# Patient Record
Sex: Female | Born: 1965 | Race: Black or African American | Hispanic: No | Marital: Married | Smoking: Never smoker
Health system: Southern US, Community
[De-identification: ages and names within clinical notes are randomized; demographics above are authoritative.]

## PROBLEM LIST (undated history)

## (undated) DIAGNOSIS — E669 Obesity, unspecified: Secondary | ICD-10-CM

## (undated) DIAGNOSIS — N200 Calculus of kidney: Secondary | ICD-10-CM

## (undated) HISTORY — DX: Obesity, unspecified: E66.9

## (undated) HISTORY — PX: TUBAL LIGATION: SHX77

## (undated) HISTORY — DX: Calculus of kidney: N20.0

---

## 2003-08-21 ENCOUNTER — Emergency Department (HOSPITAL_COMMUNITY): Admission: EM | Admit: 2003-08-21 | Discharge: 2003-08-21 | Payer: Self-pay | Admitting: Emergency Medicine

## 2003-08-31 ENCOUNTER — Ambulatory Visit (HOSPITAL_COMMUNITY): Admission: RE | Admit: 2003-08-31 | Discharge: 2003-08-31 | Payer: Self-pay | Admitting: Urology

## 2003-09-03 ENCOUNTER — Ambulatory Visit (HOSPITAL_COMMUNITY): Admission: RE | Admit: 2003-09-03 | Discharge: 2003-09-03 | Payer: Self-pay | Admitting: Urology

## 2010-10-28 ENCOUNTER — Encounter: Payer: Self-pay | Admitting: Family Medicine

## 2011-09-03 ENCOUNTER — Other Ambulatory Visit: Payer: Self-pay | Admitting: Occupational Therapy

## 2011-09-03 DIAGNOSIS — Z139 Encounter for screening, unspecified: Secondary | ICD-10-CM

## 2011-09-10 ENCOUNTER — Ambulatory Visit (HOSPITAL_COMMUNITY)
Admission: RE | Admit: 2011-09-10 | Discharge: 2011-09-10 | Disposition: A | Discharge status: Home / Self Care | Payer: 59 | Source: Ambulatory Visit | Attending: Family Medicine | Admitting: Family Medicine

## 2011-09-10 DIAGNOSIS — Z1231 Encounter for screening mammogram for malignant neoplasm of breast: Secondary | ICD-10-CM | POA: Insufficient documentation

## 2011-09-10 DIAGNOSIS — Z139 Encounter for screening, unspecified: Secondary | ICD-10-CM

## 2014-06-22 ENCOUNTER — Other Ambulatory Visit (HOSPITAL_COMMUNITY): Payer: Self-pay | Admitting: Nurse Practitioner

## 2014-06-22 DIAGNOSIS — Z139 Encounter for screening, unspecified: Secondary | ICD-10-CM

## 2014-06-30 ENCOUNTER — Ambulatory Visit (HOSPITAL_COMMUNITY)
Admission: RE | Admit: 2014-06-30 | Discharge: 2014-06-30 | Disposition: A | Discharge status: Home / Self Care | Payer: 59 | Source: Ambulatory Visit | Attending: Nurse Practitioner | Admitting: Nurse Practitioner

## 2014-06-30 DIAGNOSIS — Z1231 Encounter for screening mammogram for malignant neoplasm of breast: Secondary | ICD-10-CM | POA: Insufficient documentation

## 2014-06-30 DIAGNOSIS — Z139 Encounter for screening, unspecified: Secondary | ICD-10-CM

## 2015-06-24 ENCOUNTER — Other Ambulatory Visit (HOSPITAL_COMMUNITY): Payer: Self-pay | Admitting: Nurse Practitioner

## 2015-06-24 DIAGNOSIS — Z1231 Encounter for screening mammogram for malignant neoplasm of breast: Secondary | ICD-10-CM

## 2015-07-04 ENCOUNTER — Ambulatory Visit (HOSPITAL_COMMUNITY)
Admission: RE | Admit: 2015-07-04 | Discharge: 2015-07-04 | Disposition: A | Discharge status: Home / Self Care | Payer: 59 | Source: Ambulatory Visit | Attending: Nurse Practitioner | Admitting: Nurse Practitioner

## 2015-07-04 DIAGNOSIS — Z1231 Encounter for screening mammogram for malignant neoplasm of breast: Secondary | ICD-10-CM | POA: Diagnosis not present

## 2016-07-11 ENCOUNTER — Other Ambulatory Visit (HOSPITAL_COMMUNITY): Payer: Self-pay | Admitting: Nurse Practitioner

## 2016-07-11 DIAGNOSIS — Z1231 Encounter for screening mammogram for malignant neoplasm of breast: Secondary | ICD-10-CM

## 2016-07-25 ENCOUNTER — Ambulatory Visit (HOSPITAL_COMMUNITY): Payer: 59

## 2016-07-26 ENCOUNTER — Ambulatory Visit (HOSPITAL_COMMUNITY): Payer: 59

## 2016-08-20 ENCOUNTER — Ambulatory Visit (HOSPITAL_COMMUNITY)
Admission: RE | Admit: 2016-08-20 | Discharge: 2016-08-20 | Disposition: A | Discharge status: Home / Self Care | Payer: 59 | Source: Ambulatory Visit | Attending: Nurse Practitioner | Admitting: Nurse Practitioner

## 2016-08-20 DIAGNOSIS — Z1231 Encounter for screening mammogram for malignant neoplasm of breast: Secondary | ICD-10-CM | POA: Insufficient documentation

## 2017-07-26 ENCOUNTER — Other Ambulatory Visit (HOSPITAL_COMMUNITY): Payer: Self-pay | Admitting: Nurse Practitioner

## 2017-07-26 DIAGNOSIS — Z1231 Encounter for screening mammogram for malignant neoplasm of breast: Secondary | ICD-10-CM

## 2017-08-26 ENCOUNTER — Ambulatory Visit (HOSPITAL_COMMUNITY): Payer: 59

## 2018-10-26 ENCOUNTER — Emergency Department (HOSPITAL_COMMUNITY): Payer: Managed Care, Other (non HMO)

## 2018-10-26 ENCOUNTER — Emergency Department (HOSPITAL_COMMUNITY)
Admission: EM | Admit: 2018-10-26 | Discharge: 2018-10-26 | Disposition: A | Discharge status: Home / Self Care | Payer: Managed Care, Other (non HMO) | Attending: Emergency Medicine | Admitting: Emergency Medicine

## 2018-10-26 ENCOUNTER — Encounter (HOSPITAL_COMMUNITY): Payer: Self-pay

## 2018-10-26 DIAGNOSIS — R1030 Lower abdominal pain, unspecified: Secondary | ICD-10-CM | POA: Diagnosis present

## 2018-10-26 DIAGNOSIS — K529 Noninfective gastroenteritis and colitis, unspecified: Secondary | ICD-10-CM | POA: Diagnosis not present

## 2018-10-26 LAB — WET PREP, GENITAL
Clue Cells Wet Prep HPF POC: NONE SEEN
Sperm: NONE SEEN
Trich, Wet Prep: NONE SEEN
Yeast Wet Prep HPF POC: NONE SEEN

## 2018-10-26 LAB — URINALYSIS, ROUTINE W REFLEX MICROSCOPIC
Bilirubin Urine: NEGATIVE
Glucose, UA: NEGATIVE mg/dL
Hgb urine dipstick: NEGATIVE
Ketones, ur: 80 mg/dL — AB
Leukocytes, UA: NEGATIVE
Nitrite: NEGATIVE
Protein, ur: NEGATIVE mg/dL
Specific Gravity, Urine: 1.025 (ref 1.005–1.030)
pH: 5 (ref 5.0–8.0)

## 2018-10-26 LAB — COMPREHENSIVE METABOLIC PANEL
ALT: 14 U/L (ref 0–44)
AST: 19 U/L (ref 15–41)
Albumin: 3.8 g/dL (ref 3.5–5.0)
Alkaline Phosphatase: 68 U/L (ref 38–126)
Anion gap: 12 (ref 5–15)
BUN: 11 mg/dL (ref 6–20)
CO2: 25 mmol/L (ref 22–32)
Calcium: 9.3 mg/dL (ref 8.9–10.3)
Chloride: 103 mmol/L (ref 98–111)
Creatinine, Ser: 0.94 mg/dL (ref 0.44–1.00)
GFR calc Af Amer: >60 mL/min (ref >60)
GFR calc non Af Amer: >60 mL/min (ref >60)
Glucose, Bld: 83 mg/dL (ref 70–99)
Potassium: 3.7 mmol/L (ref 3.5–5.1)
Sodium: 140 mmol/L (ref 135–145)
Total Bilirubin: 0.9 mg/dL (ref 0.3–1.2)
Total Protein: 7.8 g/dL (ref 6.5–8.1)

## 2018-10-26 LAB — CBC WITH DIFFERENTIAL/PLATELET
Abs Immature Granulocytes: 0.05 10*3/uL (ref 0.00–0.07)
Basophils Absolute: 0 10*3/uL (ref 0.0–0.1)
Basophils Relative: 0 %
Eosinophils Absolute: 0.1 10*3/uL (ref 0.0–0.5)
Eosinophils Relative: 1 %
HCT: 40.2 % (ref 36.0–46.0)
Hemoglobin: 12.4 g/dL (ref 12.0–15.0)
Immature Granulocytes: 1 %
Lymphocytes Relative: 21 %
Lymphs Abs: 2.1 10*3/uL (ref 0.7–4.0)
MCH: 26.2 pg (ref 26.0–34.0)
MCHC: 30.8 g/dL (ref 30.0–36.0)
MCV: 84.8 fL (ref 80.0–100.0)
Monocytes Absolute: 0.6 10*3/uL (ref 0.1–1.0)
Monocytes Relative: 6 %
Neutro Abs: 7.4 10*3/uL (ref 1.7–7.7)
Neutrophils Relative %: 71 %
Platelets: 261 10*3/uL (ref 150–400)
RBC: 4.74 MIL/uL (ref 3.87–5.11)
RDW: 13.6 % (ref 11.5–15.5)
WBC: 10.3 10*3/uL (ref 4.0–10.5)
nRBC: 0 % (ref 0.0–0.2)

## 2018-10-26 MED ORDER — ONDANSETRON 4 MG PO TBDP: 4.0000 mg | ORAL_TABLET | Freq: Three times a day (TID) | ORAL | 0 refills | Status: DC | PRN

## 2018-10-26 MED ORDER — SODIUM CHLORIDE 0.9 % IV BOLUS
1000.0000 mL | Freq: Once | INTRAVENOUS | Status: AC
  Administered 2018-10-26: 1000 mL via INTRAVENOUS

## 2018-10-26 MED ORDER — METRONIDAZOLE 500 MG PO TABS: 500.0000 mg | ORAL_TABLET | Freq: Three times a day (TID) | ORAL | 0 refills | Status: AC

## 2018-10-26 MED ORDER — MORPHINE SULFATE (PF) 4 MG/ML IV SOLN
4.0000 mg | Freq: Once | INTRAVENOUS | Status: AC
  Administered 2018-10-26: 4 mg via INTRAVENOUS
  Filled 2018-10-26: qty 1

## 2018-10-26 MED ORDER — CIPROFLOXACIN HCL 500 MG PO TABS: 500.0000 mg | ORAL_TABLET | Freq: Two times a day (BID) | ORAL | 0 refills | Status: AC

## 2018-10-26 MED ORDER — IOHEXOL 300 MG/ML  SOLN
100.0000 mL | Freq: Once | INTRAMUSCULAR | Status: AC | PRN
  Administered 2018-10-26: 100 mL via INTRAVENOUS

## 2018-10-26 MED ORDER — IOPAMIDOL (ISOVUE-300) INJECTION 61%: 100.0000 mL | Freq: Once | INTRAVENOUS | Status: DC | PRN

## 2018-10-26 NOTE — ED Triage Notes (Signed)
Onset 2 days constant lower abd pain, nausea, vomiting.  Last vomited 2am.  Pt took Advil yesterday with no relief.

## 2018-10-26 NOTE — ED Provider Notes (Signed)
[facility] EMERGENCY DEPARTMENT Provider Note   CSN: [id number] Arrival date & time: 10/26/18  1647     History   Chief Complaint Chief Complaint  Patient presents with  . Abdominal Pain    HPI Kara Moreno is a 53 y.o. female who presents to ED for 3 to 4-day history of intermittent lower abdominal pain.  She reports associated nausea and 3-4 episodes of nonbloody, nonbilious emesis secondary to pain.  She has not eaten anything in over 24 hours secondary to her discomfort and vomiting.  She took Advil yesterday but ended up vomiting afterwards.  She notes vague abdominal pain in the past but not in this location and never last this long.  She had a normal bowel movement yesterday.  She denies any urinary symptoms or history of kidney stones.  She denies any sick contacts with similar symptoms.  She is sexually active in a monogamous relationship with her husband and is not concerned about STDs.  She has no vaginal discharge or abnormal bleeding, recent travel.  Prior abdominal surgeries include tubal ligation about 63yrs ago.  HPI  History reviewed. No pertinent past medical history.  There are no active problems to display for this patient.   Past Surgical History:  Procedure Laterality Date  . TUBAL LIGATION       OB History   No obstetric history on file.      Home Medications    Prior to Admission medications   Medication Sig Start Date End Date Taking? Authorizing Provider  ciprofloxacin (CIPRO) 500 MG tablet Take 1 tablet (500 mg total) by mouth 2 (two) times daily for 7 days. 10/26/18 11/02/18  Amiracle Neises, PA-C  metroNIDAZOLE (FLAGYL) 500 MG tablet Take 1 tablet (500 mg total) by mouth 3 (three) times daily for 7 days. 10/26/18 11/02/18  Nataliyah Packham, PA-C  ondansetron (ZOFRAN ODT) 4 MG disintegrating tablet Take 1 tablet (4 mg total) by mouth every 8 (eight) hours as needed for nausea or vomiting. 10/26/18   Dietrich Pates, PA-C    Family  History History reviewed. No pertinent family history.  Social History Social History   Tobacco Use  . Smoking status: Never Smoker  Substance Use Topics  . Alcohol use: Never    Frequency: Never  . Drug use: Never     Allergies   Patient has no known allergies.   Review of Systems Review of Systems  Constitutional: Negative for appetite change, chills and fever.  HENT: Negative for ear pain, rhinorrhea, sneezing and sore throat.   Eyes: Negative for photophobia and visual disturbance.  Respiratory: Negative for cough, chest tightness, shortness of breath and wheezing.   Cardiovascular: Negative for chest pain and palpitations.  Gastrointestinal: Positive for abdominal pain, nausea and vomiting. Negative for blood in stool, constipation and diarrhea.  Genitourinary: Negative for dysuria, hematuria and urgency.  Musculoskeletal: Negative for myalgias.  Skin: Negative for rash.  Neurological: Negative for dizziness, weakness and light-headedness.     Physical Exam Updated Vital Signs BP 135/68 (BP Location: Right Arm)   Pulse 78   Temp 98.3 F (36.8 C) (Oral)   Resp 18   SpO2 100%   Physical Exam Vitals signs and nursing note reviewed. Exam conducted with a chaperone present.  Constitutional:      General: She is not in acute distress.    Appearance: She is well-developed.  HENT:     Head: Normocephalic and atraumatic.     Nose: Nose normal.  Eyes:     General: No scleral icterus.       Left eye: No discharge.     Conjunctiva/sclera: Conjunctivae normal.  Neck:     Musculoskeletal: Normal range of motion and neck supple.  Cardiovascular:     Rate and Rhythm: Normal rate and regular rhythm.     Heart sounds: Normal heart sounds. No murmur. No friction rub. No gallop.   Pulmonary:     Effort: Pulmonary effort is normal. No respiratory distress.     Breath sounds: Normal breath sounds.  Abdominal:     General: Bowel sounds are normal. There is no distension.      Palpations: Abdomen is soft.     Tenderness: There is abdominal tenderness in the suprapubic area. There is no guarding.  Genitourinary:    Cervix: Discharge present.     Uterus: Normal.      Adnexa:        Right: No tenderness.         Left: No tenderness.       Comments: Pelvic exam: normal external genitalia without evidence of trauma. VULVA: normal appearing vulva with no masses, tenderness or lesion. VAGINA: normal appearing vagina with normal color and discharge, no lesions. CERVIX: normal appearing cervix without lesions, cervical motion tenderness absent, cervical os closed with out purulent discharge; No vaginal discharge. Wet prep and DNA probe for chlamydia and GC obtained.   ADNEXA: normal adnexa in size, nontender and no masses UTERUS: uterus is normal size, shape, consistency and nontender.   Musculoskeletal: Normal range of motion.  Skin:    General: Skin is warm and dry.     Findings: No rash.  Neurological:     Mental Status: She is alert.     Motor: No abnormal muscle tone.     Coordination: Coordination normal.      ED Treatments / Results  Labs (all labs ordered are listed, but only abnormal results are displayed) Labs Reviewed  WET PREP, GENITAL - Abnormal; Notable for the following components:      Result Value   WBC, Wet Prep HPF POC MANY (*)    All other components within normal limits  URINALYSIS, ROUTINE W REFLEX MICROSCOPIC - Abnormal; Notable for the following components:   APPearance HAZY (*)    Ketones, ur 80 (*)    All other components within normal limits  COMPREHENSIVE METABOLIC PANEL  CBC WITH DIFFERENTIAL/PLATELET  GC/CHLAMYDIA PROBE AMP () NOT AT [facility]    EKG None  Radiology Ct Abdomen Pelvis W Contrast  Result Date: 10/26/2018 CLINICAL DATA:  53 year old female with lower abdominal pain nausea and vomiting. EXAM: CT ABDOMEN AND PELVIS WITH CONTRAST TECHNIQUE: Multidetector CT imaging of the abdomen and pelvis was  performed using the standard protocol following bolus administration of intravenous contrast. CONTRAST:  100mL OMNIPAQUE IOHEXOL 300 MG/ML  SOLN COMPARISON:  CT Abdomen and Pelvis 08/21/2003. FINDINGS: Lower chest: Negative aside from low lung volumes with minor atelectasis. Hepatobiliary: A small round 8-9 millimeter hypodense area in the anterior right hepatic lobe is indeterminate but probably benign, such as a cyst or hemangioma. There is a similar 9 millimeter hypodense lesion in the posterior lobe which seems to have simple fluid density. These were not evident in 2004. Questionable underlying hepatic steatosis. Negative gallbladder. Pancreas: There is a tiny hypodense area in the pancreatic body on series 3, image 24. This was not evident in 2004. Otherwise normal pancreatic enhancement. No main pancreatic ductal enlargement. Spleen: Negative.  Adrenals/Urinary Tract: Normal adrenal glands. Renal enhancement and contrast excretion is symmetric and normal. Decompressed proximal ureters. Diminutive and unremarkable urinary bladder. Stomach/Bowel: Negative rectum and distal sigmoid. Moderate inflammation at the junction of the descending and sigmoid colon where extensive diverticulosis is noted. Inflammation continues through the proximal sigmoid where wall there is pronounced circumferential wall thickening and adjacent mesenteric edema. All told, a segment of about 15 centimeters of large bowel is affected. No extraluminal gas. Trace fluid layering in the gutter. No fluid collection. Diverticulosis continues upstream to the transverse colon, and there is extensive diverticulosis in the right colon. No other large bowel inflammation. Normal appendix. Negative terminal ileum. No dilated small bowel. Decompressed stomach and duodenum. No free air.  No free fluid in the upper abdomen. Vascular/Lymphatic: Suboptimal intravascular contrast bolus but the major arterial structures appear to be patent. The portal venous  system appears grossly patent. Reproductive: Negative. Other: Trace simple appearing pelvic free fluid mostly on the left (series 3, image 74). Musculoskeletal: No acute osseous abnormality identified. IMPRESSION: 1. Moderately inflamed 15 cm segment of large bowel from the distal descending through the proximal sigmoid colon compatible with Acute Diverticulitis/colitis. Trace free fluid appears reactive. No abscess or complicating features. 2. Occasional subcentimeter hypodense areas in the liver and pancreatic body are nonspecific but probably benign. Guidelines suggest annual imaging of the pancreatic lesion x5 years to document stability. Abdomen MRI (pancreas protocol without and with contrast) is the modality of choice. This recommendation follows ACR consensus guidelines: Management of Incidental Pancreatic Cysts: A White Paper of the ACR Incidental Findings Committee. J Am Coll Radiol 2017;14:911-923. Electronically Signed   By: Odessa FlemingH  Hall M.D.   On: 10/26/2018 20:37    Procedures Procedures (including critical care time)  Medications Ordered in ED Medications  iopamidol (ISOVUE-300) 61 % injection 100 mL (has no administration in time range)  sodium chloride 0.9 % bolus 1,000 mL (1,000 mLs Intravenous New Bag/Given 10/26/18 1822)  morphine 4 MG/ML injection 4 mg (4 mg Intravenous Given 10/26/18 1822)  iohexol (OMNIPAQUE) 300 MG/ML solution 100 mL (100 mLs Intravenous Contrast Given 10/26/18 1949)     Initial Impression / Assessment and Plan / ED Course  I have reviewed the triage vital signs and the nursing notes.  Pertinent labs & imaging results that were available during my care of the patient were reviewed by me and considered in my medical decision making (see chart for details).     53 year old female presents to ED for 3 to 4-day history of lower abdominal pain, nausea and nonbloody, nonbilious emesis.  She had normal bowel movement yesterday.  She has had vague sounding abdominal  pain in the past but has not lasted this long.  On my exam there is tenderness to palpation of the suprapubic area without rebound or guarding.  Pelvic exam revealed no cervical motion tenderness, adnexal tenderness.  Scant thin white discharge noted.  Lab work including CBC, CMP, urinalysis unremarkable.  Wet prep is negative.  GC chlamydia probe pending.  CT and pelvis shows findings consistent with possible colitis.  She denies any diarrhea, but due to remainder of lab work and work-up being reassuring, will treat for colitis.  She was scheduled for colonoscopy about 2 months ago but had to reschedule due to a death in the family.  Have encouraged her to have a colonoscopy done because of her age and also due to possible diverticulosis.  Patient is agreeable to the plan.  She is comfortable here and  is able to tolerate p.o. intake without difficulty prior to discharge.  Patient is hemodynamically stable, in NAD, and able to ambulate in the ED. Evaluation does not show pathology that would require ongoing emergent intervention or inpatient treatment. I explained the diagnosis to the patient. Pain has been managed and has no complaints prior to discharge. Patient is comfortable with above plan and is stable for discharge at this time. All questions were answered prior to disposition. Strict return precautions for returning to the ED were discussed. Encouraged follow up with PCP.    Portions of this note were generated with Scientist, clinical (histocompatibility and immunogenetics). Dictation errors may occur despite best attempts at proofreading.   Final Clinical Impressions(s) / ED Diagnoses   Final diagnoses:  Colitis    ED Discharge Orders         Ordered    metroNIDAZOLE (FLAGYL) 500 MG tablet  3 times daily     10/26/18 2107    ciprofloxacin (CIPRO) 500 MG tablet  2 times daily     10/26/18 2107    ondansetron (ZOFRAN ODT) 4 MG disintegrating tablet  Every 8 hours PRN     10/26/18 2107           Dietrich Pates,  PA-C 10/26/18 2120    Derwood Kaplan, MD 10/28/18 2325

## 2018-10-26 NOTE — Discharge Instructions (Signed)
Take the antibiotics as directed.  Take Zofran as needed for nausea. Return to ED for worsening symptoms, fever, increased abdominal pain, blood in your stool.

## 2018-10-26 NOTE — ED Notes (Signed)
Patient transported to CT 

## 2018-10-27 LAB — GC/CHLAMYDIA PROBE AMP (~~LOC~~) NOT AT ARMC
Chlamydia: NEGATIVE
Neisseria Gonorrhea: NEGATIVE

## 2018-11-13 ENCOUNTER — Encounter: Payer: Self-pay | Admitting: Gastroenterology

## 2018-11-17 ENCOUNTER — Other Ambulatory Visit (HOSPITAL_COMMUNITY): Payer: Self-pay | Admitting: Nurse Practitioner

## 2018-11-17 DIAGNOSIS — Z1231 Encounter for screening mammogram for malignant neoplasm of breast: Secondary | ICD-10-CM

## 2018-12-03 ENCOUNTER — Ambulatory Visit (HOSPITAL_COMMUNITY)
Admission: RE | Admit: 2018-12-03 | Discharge: 2018-12-03 | Disposition: A | Discharge status: Home / Self Care | Payer: Managed Care, Other (non HMO) | Source: Ambulatory Visit | Attending: Nurse Practitioner | Admitting: Nurse Practitioner

## 2018-12-03 DIAGNOSIS — Z1231 Encounter for screening mammogram for malignant neoplasm of breast: Secondary | ICD-10-CM | POA: Insufficient documentation

## 2019-01-15 ENCOUNTER — Encounter: Payer: Self-pay | Admitting: Gastroenterology

## 2019-01-15 ENCOUNTER — Ambulatory Visit (INDEPENDENT_AMBULATORY_CARE_PROVIDER_SITE_OTHER): Payer: Managed Care, Other (non HMO) | Admitting: Gastroenterology

## 2019-01-15 ENCOUNTER — Telehealth: Payer: Self-pay | Admitting: *Deleted

## 2019-01-15 ENCOUNTER — Other Ambulatory Visit: Payer: Self-pay

## 2019-01-15 ENCOUNTER — Other Ambulatory Visit: Payer: Self-pay | Admitting: *Deleted

## 2019-01-15 DIAGNOSIS — R933 Abnormal findings on diagnostic imaging of other parts of digestive tract: Secondary | ICD-10-CM | POA: Diagnosis not present

## 2019-01-15 DIAGNOSIS — K869 Disease of pancreas, unspecified: Secondary | ICD-10-CM | POA: Diagnosis not present

## 2019-01-15 DIAGNOSIS — K769 Liver disease, unspecified: Secondary | ICD-10-CM | POA: Insufficient documentation

## 2019-01-15 MED ORDER — PEG 3350-KCL-NA BICARB-NACL 420 G PO SOLR: 4000.0000 mL | Freq: Once | ORAL | 0 refills | Status: AC

## 2019-01-15 NOTE — Telephone Encounter (Signed)
Called patient to schedule TCS with [facility]. She is scheduled for 7/13 at 8:30am. Patient aware will need to arrive at [facility] around 7:30am. Aware I am mailing instructions (confirmed address). Prep sent to the pharmacy. Orders entered.

## 2019-01-15 NOTE — Progress Notes (Signed)
REVIEWED-NO ADDITIONAL RECOMMENDATIONS. 

## 2019-01-15 NOTE — Patient Instructions (Signed)
1. Colonoscopy to be scheduled.  Please see separate instructions. 2. We will follow-up on the 2 liver lesions, pancreatic lesions with an MRI of the abdomen in 1 year.  These lesions appear benign but radiologist recommends following in 1 year as discussed.

## 2019-01-15 NOTE — Progress Notes (Addendum)
REVIEWED-NO ADDITIONAL RECOMMENDATIONS.  Primary Care Physician:  Erasmo Downer, NP Primary GI:  Jonette Eva, MD    Patient Location: Home Provider Location: [facility] office  Reason for Visit: To schedule colonoscopy  Persons present on the virtual encounter, with roles: Patient, provider (myself), Elinor Parkinson, CMA (updated meds, allergies)  Total time (minutes) spent on medical discussion: 20 minutes (15 minutes of median intra-service time, 5 minutes of chart review)  Due to COVID-19, visit was conducted using ZOOM method.  Visit was requested by patient.  Virtual Visit via ZOOM  I connected with NESHIA ELSASS on 01/15/19 at  2:00 PM EDT by ZOOM and verified that I am speaking with the correct person using two identifiers.   I discussed the limitations, risks, security and privacy concerns of performing an evaluation and management service by telephone/video and the availability of in person appointments. I also discussed with the patient that there may be a patient responsible charge related to this service. The patient expressed understanding and agreed to proceed.   HPI:   Kara Moreno is a 53 y.o. female who presents at the request of Cheron Every, FNP at [facility] to schedule screening colonoscopy.  Since we received her referral for screening colonoscopy, patient ended up in the emergency department with 3-day history of abdominal pain and vomiting. CT abdomen pelvis with contrast.  She had questionable underlying hepatic steatosis.  Moderate inflammation at the junction of the descending and sigmoid colon were extensive diverticulosis noted.  Inflammation continues to the proximal sigmoid where wall there is pronounced with circumferential wall thickening and adjacent mesenteric edema.  A total of 15 cm of the bowel affected.  Occasional subcentimeter hypodense areas in the liver and pancreatic body nonspecific but probably benign.   Guidelines suggest annual imaging of the pancreatic lesion for 5 years to document stability.  Abdominal MRI with pancreatic protocol is the modality of choice.  She had a milder episode about a week ago described as abdominal cramping, bloating and nausea.  Subsided fairly quickly.  Bowel movements lately have been fairly regular.  She denies any blood in the stool or melena.  Denies abdominal pain, heartburn, dysphagia.  No known family history of inflammatory bowel disease or colon cancer.  Current Outpatient Medications  Medication Sig Dispense Refill   ondansetron (ZOFRAN ODT) 4 MG disintegrating tablet Take 1 tablet (4 mg total) by mouth every 8 (eight) hours as needed for nausea or vomiting. (Patient taking differently: Take 4 mg by mouth as needed for nausea or vomiting. ) 4 tablet 0   Vitamin D, Ergocalciferol, (DRISDOL) 1.25 MG (50000 UT) CAPS capsule Take 50,000 Units by mouth once a week.     No current facility-administered medications for this visit.     Past Medical History:  Diagnosis Date   Kidney stone    Obesity     Past Surgical History:  Procedure Laterality Date   TUBAL LIGATION      Family History  Problem Relation Age of Onset   Diabetes Mellitus II Mother    Diverticulitis Mother    Lung cancer Father    Renal Disease Brother    Colon cancer Neg Hx    Inflammatory bowel disease Neg Hx     Social History   Socioeconomic History   Marital status: Married    Spouse name: Not on file   Number of children: Not on file   Years of education: Not on file  Highest education level: Not on file  Occupational History   Not on file  Social Needs   Financial resource strain: Not on file   Food insecurity:    Worry: Not on file    Inability: Not on file   Transportation needs:    Medical: Not on file    Non-medical: Not on file  Tobacco Use   Smoking status: Never Smoker   Smokeless tobacco: Never Used  Substance and Sexual  Activity   Alcohol use: Never    Frequency: Never   Drug use: Never   Sexual activity: Not on file  Lifestyle   Physical activity:    Days per week: Not on file    Minutes per session: Not on file   Stress: Not on file  Relationships   Social connections:    Talks on phone: Not on file    Gets together: Not on file    Attends religious service: Not on file    Active member of club or organization: Not on file    Attends meetings of clubs or organizations: Not on file    Relationship status: Not on file   Intimate partner violence:    Fear of current or ex partner: Not on file    Emotionally abused: Not on file    Physically abused: Not on file    Forced sexual activity: Not on file  Other Topics Concern   Not on file  Social History Narrative   Not on file      ROS:  General: Negative for anorexia, weight loss, fever, chills, fatigue, weakness. Eyes: Negative for vision changes.  ENT: Negative for hoarseness, difficulty swallowing , nasal congestion. CV: Negative for chest pain, angina, palpitations, dyspnea on exertion, peripheral edema.  Respiratory: Negative for dyspnea at rest, dyspnea on exertion, cough, sputum, wheezing.  GI: See history of present illness. GU:  Negative for dysuria, hematuria, urinary incontinence, urinary frequency, nocturnal urination.  MS: Negative for joint pain, low back pain.  Derm: Negative for rash or itching.  Neuro: Negative for weakness, abnormal sensation, seizure, frequent headaches, memory loss, confusion.  Psych: Negative for anxiety, depression, suicidal ideation, hallucinations.  Endo: Negative for unusual weight change.  Heme: Negative for bruising or bleeding. Allergy: Negative for rash or hives.   Observations/Objective: Patient reports weighing 260 pounds, 5 foot 5 inches equaling BMI of 43.  Patient appears well.  No acute distress.  Assessment and Plan: Very pleasant 53 year old female presenting to schedule  her first ever colonoscopy.  Currently not having any problems.  Back in January she had acute onset abdominal pain and nausea was seen in the emergency department.  CT showed abnormality of the left colon involving 15 cm of the descending colon/sigmoid colon.  Was treated with Cipro and Flagyl.  Clinically she is improved.  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  We will schedule her for an MRI in 1 year.  Schedule her for colonoscopy with Dr. fields in the near future once we are out of this COVID-19 crisis.  I have discussed the risks, alternatives, benefits with regards to but not limited to the risk of reaction to medication, bleeding, infection, perforation and the patient is agreeable to proceed. Written consent to be obtained.   Follow Up Instructions:  I discussed the assessment and treatment plan with the patient. The patient was provided an opportunity to ask questions and all were answered. The patient agreed with the plan and demonstrated an understanding of the instructions. AVS mailed to patient's home address.   The patient was advised to call back or seek an in-person evaluation if the symptoms worsen or if the condition fails to improve as anticipated.  I provided 20 minutes of virtual face-to-face time during this encounter.   Neil Crouch, PA-C

## 2019-04-07 ENCOUNTER — Telehealth: Payer: Self-pay | Admitting: Gastroenterology

## 2019-04-07 ENCOUNTER — Telehealth: Payer: Self-pay | Admitting: *Deleted

## 2019-04-07 NOTE — Telephone Encounter (Signed)
Called patient and she is scheduled for COVID-19 testing 7/9 at 3:25pm. She is aware will need to remain quarantined until after procedure. If failing to do so will cancel procedure. Patient voiced understanding. She is aware of location for testing.

## 2019-04-07 NOTE — Telephone Encounter (Signed)
See prior note. Already spoke with pt 

## 2019-04-07 NOTE — Telephone Encounter (Signed)
Pt was returning a call to [place]. She said she was at work and to leave a VM. [phone]

## 2019-04-07 NOTE — Telephone Encounter (Signed)
LMOVM for pt 

## 2019-04-16 ENCOUNTER — Other Ambulatory Visit (HOSPITAL_COMMUNITY)
Admission: RE | Admit: 2019-04-16 | Discharge: 2019-04-16 | Disposition: A | Discharge status: Home / Self Care | Payer: Managed Care, Other (non HMO) | Source: Ambulatory Visit | Attending: Gastroenterology | Admitting: Gastroenterology

## 2019-04-16 ENCOUNTER — Other Ambulatory Visit: Payer: Self-pay

## 2019-04-16 DIAGNOSIS — Z1159 Encounter for screening for other viral diseases: Secondary | ICD-10-CM | POA: Insufficient documentation

## 2019-04-16 DIAGNOSIS — Z01812 Encounter for preprocedural laboratory examination: Secondary | ICD-10-CM | POA: Diagnosis present

## 2019-04-17 LAB — SARS CORONAVIRUS 2 (TAT 6-24 HRS): SARS Coronavirus 2: NEGATIVE

## 2019-04-20 ENCOUNTER — Other Ambulatory Visit: Payer: Self-pay

## 2019-04-20 ENCOUNTER — Encounter (HOSPITAL_COMMUNITY)
Admission: RE | Disposition: A | Discharge status: Home / Self Care | Payer: Self-pay | Source: Home / Self Care | Attending: Gastroenterology

## 2019-04-20 ENCOUNTER — Encounter (HOSPITAL_COMMUNITY): Payer: Self-pay | Admitting: *Deleted

## 2019-04-20 ENCOUNTER — Ambulatory Visit (HOSPITAL_COMMUNITY)
Admission: RE | Admit: 2019-04-20 | Discharge: 2019-04-20 | Disposition: A | Discharge status: Home / Self Care | Payer: Managed Care, Other (non HMO) | Attending: Gastroenterology | Admitting: Gastroenterology

## 2019-04-20 DIAGNOSIS — Q438 Other specified congenital malformations of intestine: Secondary | ICD-10-CM | POA: Insufficient documentation

## 2019-04-20 DIAGNOSIS — R933 Abnormal findings on diagnostic imaging of other parts of digestive tract: Secondary | ICD-10-CM | POA: Diagnosis not present

## 2019-04-20 DIAGNOSIS — Z833 Family history of diabetes mellitus: Secondary | ICD-10-CM | POA: Insufficient documentation

## 2019-04-20 DIAGNOSIS — K529 Noninfective gastroenteritis and colitis, unspecified: Secondary | ICD-10-CM | POA: Insufficient documentation

## 2019-04-20 DIAGNOSIS — Z801 Family history of malignant neoplasm of trachea, bronchus and lung: Secondary | ICD-10-CM | POA: Diagnosis not present

## 2019-04-20 DIAGNOSIS — K648 Other hemorrhoids: Secondary | ICD-10-CM | POA: Insufficient documentation

## 2019-04-20 DIAGNOSIS — Z841 Family history of disorders of kidney and ureter: Secondary | ICD-10-CM | POA: Insufficient documentation

## 2019-04-20 DIAGNOSIS — K644 Residual hemorrhoidal skin tags: Secondary | ICD-10-CM | POA: Diagnosis not present

## 2019-04-20 DIAGNOSIS — E669 Obesity, unspecified: Secondary | ICD-10-CM | POA: Diagnosis not present

## 2019-04-20 DIAGNOSIS — Z6841 Body Mass Index (BMI) 40.0 and over, adult: Secondary | ICD-10-CM | POA: Insufficient documentation

## 2019-04-20 DIAGNOSIS — Z87442 Personal history of urinary calculi: Secondary | ICD-10-CM | POA: Diagnosis not present

## 2019-04-20 DIAGNOSIS — K56609 Unspecified intestinal obstruction, unspecified as to partial versus complete obstruction: Secondary | ICD-10-CM | POA: Diagnosis not present

## 2019-04-20 DIAGNOSIS — Z8379 Family history of other diseases of the digestive system: Secondary | ICD-10-CM | POA: Diagnosis not present

## 2019-04-20 DIAGNOSIS — K56699 Other intestinal obstruction unspecified as to partial versus complete obstruction: Secondary | ICD-10-CM | POA: Diagnosis not present

## 2019-04-20 DIAGNOSIS — K5730 Diverticulosis of large intestine without perforation or abscess without bleeding: Secondary | ICD-10-CM | POA: Insufficient documentation

## 2019-04-20 HISTORY — PX: COLONOSCOPY: SHX5424

## 2019-04-20 HISTORY — PX: BIOPSY: SHX5522

## 2019-04-20 SURGERY — COLONOSCOPY
Anesthesia: Moderate Sedation

## 2019-04-20 MED ORDER — MEPERIDINE HCL 100 MG/ML IJ SOLN
INTRAMUSCULAR | Status: AC
  Filled 2019-04-20: qty 2

## 2019-04-20 MED ORDER — STERILE WATER FOR IRRIGATION IR SOLN
Status: DC | PRN
  Administered 2019-04-20: 1.5 mL

## 2019-04-20 MED ORDER — SODIUM CHLORIDE 0.9 % IV SOLN
INTRAVENOUS | Status: DC
  Administered 2019-04-20: 08:00:00 via INTRAVENOUS

## 2019-04-20 MED ORDER — MIDAZOLAM HCL 5 MG/5ML IJ SOLN
INTRAMUSCULAR | Status: AC
  Filled 2019-04-20: qty 10

## 2019-04-20 MED ORDER — MIDAZOLAM HCL 5 MG/5ML IJ SOLN
INTRAMUSCULAR | Status: DC | PRN
  Administered 2019-04-20 (×3): 2 mg via INTRAVENOUS
  Administered 2019-04-20: 1 mg via INTRAVENOUS

## 2019-04-20 MED ORDER — MEPERIDINE HCL 100 MG/ML IJ SOLN
INTRAMUSCULAR | Status: DC | PRN
  Administered 2019-04-20 (×3): 25 mg via INTRAVENOUS

## 2019-04-20 MED ORDER — SPOT INK MARKER SYRINGE KIT
PACK | SUBMUCOSAL | Status: AC
  Filled 2019-04-20: qty 5

## 2019-04-20 MED ORDER — SPOT INK MARKER SYRINGE KIT
PACK | SUBMUCOSAL | Status: DC | PRN
  Administered 2019-04-20: 2 mL via SUBMUCOSAL

## 2019-04-20 NOTE — H&P (Signed)
  Primary Care Physician:  Renee Rival, NP Primary Gastroenterologist:  Dr. Oneida Alar  Pre-Procedure History & Physical: HPI:  Kara Moreno is a 53 y.o. female here for Abnormal CT scan: COLON.  Past Medical History:  Diagnosis Date  . Kidney stone   . Obesity     Past Surgical History:  Procedure Laterality Date  . TUBAL LIGATION      Prior to Admission medications   Not on File    Allergies as of 01/15/2019  . (No Known Allergies)    Family History  Problem Relation Age of Onset  . Diabetes Mellitus II Mother   . Diverticulitis Mother   . Lung cancer Father   . Renal Disease Brother   . Colon cancer Neg Hx   . Inflammatory bowel disease Neg Hx     Social History   Socioeconomic History  . Marital status: Married    Spouse name: Not on file  . Number of children: Not on file  . Years of education: Not on file  . Highest education level: Not on file  Occupational History  . Not on file  Social Needs  . Financial resource strain: Not on file  . Food insecurity    Worry: Not on file    Inability: Not on file  . Transportation needs    Medical: Not on file    Non-medical: Not on file  Tobacco Use  . Smoking status: Never Smoker  . Smokeless tobacco: Never Used  Substance and Sexual Activity  . Alcohol use: Never    Frequency: Never  . Drug use: Never  . Sexual activity: Not on file  Lifestyle  . Physical activity    Days per week: Not on file    Minutes per session: Not on file  . Stress: Not on file  Relationships  . Social Herbalist on phone: Not on file    Gets together: Not on file    Attends religious service: Not on file    Active member of club or organization: Not on file    Attends meetings of clubs or organizations: Not on file    Relationship status: Not on file  . Intimate partner violence    Fear of current or ex partner: Not on file    Emotionally abused: Not on file    Physically abused: Not on file   Forced sexual activity: Not on file  Other Topics Concern  . Not on file  Social History Narrative  . Not on file    Review of Systems: See HPI, otherwise negative ROS   Physical Exam: BP (!) 149/76   Pulse 80   Temp 97.9 F (36.6 C) (Oral)   Resp 17   Ht 5\' 5"  (1.651 m)   Wt 122.5 kg   SpO2 96%   BMI 44.93 kg/m  General:   Alert,  pleasant and cooperative in NAD Head:  Normocephalic and atraumatic. Neck:  Supple; Lungs:  Clear throughout to auscultation.    Heart:  Regular rate and rhythm. Abdomen:  Soft, nontender and nondistended. Normal bowel sounds, without guarding, and without rebound.   Neurologic:  Alert and  oriented x4;  grossly normal neurologically.  Impression/Plan:    Abnormal CT scan  Plan:  TCS TODAY. Discussed with the patient and all questioned fully answered. She will call me if any problems arise.

## 2019-04-20 NOTE — Op Note (Signed)
[facility] Patient Name: Kara Moreno Procedure Date: 04/20/2019 8:18 AM MRN: [id number] Date of Birth: [birth year] Attending MD: Barney Drain MD, MD CSN: [id number] Age: 53 Admit Type: Outpatient Procedure:                Colonoscopy WITH COLD FORCEPS BIOPSY Indications:              Abnormal CT of the GI tract Providers:                Barney Drain MD, MD, Charlsie Quest. Theda Sers RN, RN,                            Nelma Rothman, Technician Referring MD:             Renee Rival Medicines:                Meperidine 75 mg IV, Midazolam 7 mg IV Complications:            No immediate complications. Estimated Blood Loss:     Estimated blood loss was minimal. Procedure:                Pre-Anesthesia Assessment:                           - Prior to the procedure, a History and Physical                            was performed, and patient medications and                            allergies were reviewed. The patient's tolerance of                            previous anesthesia was also reviewed. The risks                            and benefits of the procedure and the sedation                            options and risks were discussed with the patient.                            All questions were answered, and informed consent                            was obtained. Prior Anticoagulants: The patient has                            taken no previous anticoagulant or antiplatelet                            agents. ASA Grade Assessment: II - A patient with                            mild systemic disease. After reviewing the risks  and benefits, the patient was deemed in                            satisfactory condition to undergo the procedure.                            After obtaining informed consent, the colonoscope                            was passed under direct vision. Throughout the                            procedure, the patient's blood  pressure, pulse, and                            oxygen saturations were monitored continuously. The                            CF-HQ190L ([id number] scope was introduced through                            the anus and advanced to the the cecum, identified                            by appendiceal orifice and ileocecal valve. The                            colonoscopy was performed with difficulty due to                            bowel stenosis. Successful completion of the                            procedure was aided by withdrawing the scope and                            replacing with the pediatric colonoscope,                            straightening and shortening the scope to obtain                            bowel loop reduction and COLOWRAP. The patient                            tolerated the procedure well. The quality of the                            bowel preparation was good. The ileocecal valve,                            appendiceal orifice, and rectum were photographed. Scope In: 9:01:42 AM Scope Out: 9:26:52 AM Scope Withdrawal Time: 0 hours 15 minutes 40 seconds  Total Procedure Duration: 0 hours 25 minutes 10 seconds  Findings:      Multiple small and large-mouthed diverticula were found in the entire       colon.      A benign-appearing, intrinsic severe stenosis measuring 5 cm (in length)       x 1 cm (inner diameter) was found in the sigmoid colon and was       traversed. Biopsies were taken with a cold forceps for histology.      The recto-sigmoid colon and sigmoid colon were grossly tortuous.      External and internal hemorrhoids were found. Impression:               - MODERATE Diverticulosis in the entire examined                            colon.                           - Stricture/PARTIAL OBSTRUCTION in the sigmoid                            colon LIKELY DUE TO PRIOR EPISODE OF SEVERE                            DIVERTICULITIS. Biopsied.                            - External and internal hemorrhoids. Moderate Sedation:      Moderate (conscious) sedation was administered by the endoscopy nurse       and supervised by the endoscopist. The following parameters were       monitored: oxygen saturation, heart rate, blood pressure, and response       to care. Total physician intraservice time was 37 minutes. Recommendation:           - Patient has a contact number available for                            emergencies. The signs and symptoms of potential                            delayed complications were discussed with the                            patient. Return to normal activities tomorrow.                            Written discharge instructions were provided to the                            patient.                           - High fiber diet. LOSE WEIGHT TO A BMI < 30.                           - Continue present medications. ADD COLACE 1 OR 2  DAILY TO SOFTEN STOOL AND REDUCE ABDOMINAL PAIN DUE                            TO PARTIAL SIGMOID OBSTRUCTION.                           - Await pathology results.                           - Repeat colonoscopy in 10 years for surveillance.                           - Refer to a surgeon at appointment to be scheduled                            TO DISCUSS SIGMOID COLECTOMY.                           - Return to my office in 6 months. Procedure Code(s):        --- Professional ---                           [phone], Colonoscopy, flexible; with biopsy, single                            or multiple                           99153, Moderate sedation; each additional 15                            minutes intraservice time                           G0500, Moderate sedation services provided by the                            same physician or other qualified health care                            professional performing a gastrointestinal                            endoscopic  service that sedation supports,                            requiring the presence of an independent trained                            observer to assist in the monitoring of the                            patient's level of consciousness and physiological  status; initial 15 minutes of intra-service time;                            patient age 61 years or older (additional time may                            be reported with [id number], as appropriate) Diagnosis Code(s):        --- Professional ---                           K64.8, Other hemorrhoids                           K56.699, Other intestinal obstruction unspecified                            as to partial versus complete obstruction                           K57.30, Diverticulosis of large intestine without                            perforation or abscess without bleeding                           R93.3, Abnormal findings on diagnostic imaging of                            other parts of digestive tract                           Q43.8, Other specified congenital malformations of                            intestine CPT copyright 2019 American [employer] Association. All rights reserved. The codes documented in this report are preliminary and upon coder review may  be revised to meet current compliance requirements. Jonette EvaSandi Fields, MD Jonette EvaSandi Fields MD, MD 04/20/2019 10:02:21 AM This report has been signed electronically. Number of Addenda: 0

## 2019-04-20 NOTE — Progress Notes (Signed)
CC'D TO PCP °

## 2019-04-20 NOTE — Discharge Instructions (Signed)
THE FINDING ON THE CT SCAN LOOKS LIKE ITS DUE TO PARTIAL OBSTRUCTION OF YOUR SIGMOID COLON AFTER A SEVERE EPISODE OF DIVERTICULITIS. I COULD NOT COMPLETE YOUR COLONOSCOPY WITH A PEDIATRIC COLONOSCOPY. I HAD TO CHANGE TO THE ULTRASLIM SCOPE. I LOOKED AT THE JAN 2020 CT AGAIN WITH THE RADIOLOGIST AND IT DOES NOT APPEAR TO BE CANCER. THE COLONOSCOPY DOES NOT APPEAR TO BE CANCER. THE LESIONS IN YOUR LIVER ARE BENIGN.   You DID NOT HAVE ANY POLYPS. YOU HAVE DIVERTICULOSIS IN YOUR LEFT and RIGHT COLON. You have internal AND EXTERNAL hemorrhoids.   DRINK WATER TO KEEP URINE LIGHT YELLOW.  CONTINUE YOUR WEIGHT LOSS EFFORTS. YOUR BODY MASS INDEX IS OVER 40 WHICH MEANS YOU ARE MORBIDLY OBESE. OBESITY IS ASSOCIATED WITH AN INCREASED FOR CIRRHOSIS AND ALL CANCERS, INCLUDING ESOPHAGEAL AND COLON CANCER  AND DECREASES YOUR LIFE EXPECTANCY 10 YEARS. I RECOMMEND YOU READ AND FOLLOW RECOMMENDATIONS BY DR. MARK HYMAN, "10-DAY DETOX DIET".  A WEIGHT OF 240 LBS OR LESS WILL GET YOUR BODY MASS INDEX(BMI) UNDER 40 BUT YOUR BODY MASS INDEX WILL STILL BE OVER 30 WHICH MEANS YOU ARE OBESE.  A WEIGHT OF 179 LBS OR LESS  WILL GET YOUR BODY MASS INDEX(BMI) UNDER 30. IF YOU TRY AND CANNOT LOSE WEIGHT OVER THE NEXT YEAR, PLEASE CALL FOR A REFERRAL TO THE BARIATRIC WEIGHT LOSS CLINIC.  FOLLOW A HIGH FIBER DIET. AVOID ITEMS THAT CAUSE BLOATING & GAS. SEE INFO BELOW.   TO REDUCE ABDOMINAL PAIN DUE TO THE PARTIAL SIGMOID OBSTRUCTION, ADD ONE OR TWO COLACE DAILY TO SOFTEN STOOL.   YOUR BIOPSY RESULTS WILL BE BACK IN 5 BUSINESS DAYS.  I WILL REFER YOU TO GENERAL SURGERY TO DISCUSS HAVING A SIGMOID COLECTOMY.  FOLLOW UP IN 6 MOS WITH DR. Jenah Vanasten.  Next colonoscopy in 10 years.   Colonoscopy Care After Read the instructions outlined below and refer to this sheet in the next week. These discharge instructions provide you with general information on caring for yourself after you leave the hospital. While your treatment has been  planned according to the most current medical practices available, unavoidable complications occasionally occur. If you have any problems or questions after discharge, call DR. Narissa Beaufort, [phone].  ACTIVITY  You may resume your regular activity, but move at a slower pace for the next 24 hours.   Take frequent rest periods for the next 24 hours.   Walking will help get rid of the air and reduce the bloated feeling in your belly (abdomen).   No driving for 24 hours (because of the medicine (anesthesia) used during the test).   You may shower.   Do not sign any important legal documents or operate any machinery for 24 hours (because of the anesthesia used during the test).    NUTRITION  Drink plenty of fluids.   You may resume your normal diet as instructed by your doctor.   Begin with a light meal and progress to your normal diet. Heavy or fried foods are harder to digest and may make you feel sick to your stomach (nauseated).   Avoid alcoholic beverages for 24 hours or as instructed.    MEDICATIONS  You may resume your normal medications.   WHAT YOU CAN EXPECT TODAY  Some feelings of bloating in the abdomen.   Passage of more gas than usual.   Spotting of blood in your stool or on the toilet paper  .  IF YOU HAD POLYPS REMOVED DURING THE COLONOSCOPY:  Eat a  soft diet IF YOU HAVE NAUSEA, BLOATING, ABDOMINAL PAIN, OR VOMITING.    FINDING OUT THE RESULTS OF YOUR TEST Not all test results are available during your visit. DR. Darrick PennaFIELDS WILL CALL YOU WITHIN 7 DAYS OF YOUR PROCEDUE WITH YOUR RESULTS. Do not assume everything is normal if you have not heard from DR. Sharnae Winfree IN ONE WEEK, CALL HER OFFICE AT [phone].  SEEK IMMEDIATE MEDICAL ATTENTION AND CALL THE OFFICE: [phone] IF:  You have more than a spotting of blood in your stool.   Your belly is swollen (abdominal distention).   You are nauseated or vomiting.   You have a temperature over 101F.   You  have abdominal pain or discomfort that is severe or gets worse throughout the day.   High-Fiber Diet A high-fiber diet changes your normal diet to include more whole grains, legumes, fruits, and vegetables. Changes in the diet involve replacing refined carbohydrates with unrefined foods. The calorie level of the diet is essentially unchanged. The Dietary Reference Intake (recommended amount) for adult males is 38 grams per day. For adult females, it is 25 grams per day. Pregnant and lactating women should consume 28 grams of fiber per day. Fiber is the intact part of a plant that is not broken down during digestion. Functional fiber is fiber that has been isolated from the plant to provide a beneficial effect in the body.  PURPOSE  Increase stool bulk.   Ease and regulate bowel movements.   Lower cholesterol.   REDUCE RISK OF COLON CANCER  INDICATIONS THAT YOU NEED MORE FIBER  Constipation and hemorrhoids.   Uncomplicated diverticulosis (intestine condition) and irritable bowel syndrome.   Weight management.   As a protective measure against hardening of the arteries (atherosclerosis), diabetes, and cancer.   GUIDELINES FOR INCREASING FIBER IN THE DIET  Start adding fiber to the diet slowly. A gradual increase of about 5 more grams (2 slices of whole-wheat bread, 2 servings of most fruits or vegetables, or 1 bowl of high-fiber cereal) per day is best. Too rapid an increase in fiber may result in constipation, flatulence, and bloating.   Drink enough water and fluids to keep your urine clear or pale yellow. Water, juice, or caffeine-free drinks are recommended. Not drinking enough fluid may cause constipation.   Eat a variety of high-fiber foods rather than one type of fiber.   Try to increase your intake of fiber through using high-fiber foods rather than fiber pills or supplements that contain small amounts of fiber.   The goal is to change the types of food eaten. Do not  supplement your present diet with high-fiber foods, but replace foods in your present diet.    INCLUDE A VARIETY OF FIBER SOURCES  Replace refined and processed grains with whole grains, canned fruits with fresh fruits, and incorporate other fiber sources. White rice, white breads, and most bakery goods contain little or no fiber.   Brown whole-grain rice, buckwheat oats, and many fruits and vegetables are all good sources of fiber. These include: broccoli, Brussels sprouts, cabbage, cauliflower, beets, sweet potatoes, white potatoes (skin on), carrots, tomatoes, eggplant, squash, berries, fresh fruits, and dried fruits.   Cereals appear to be the richest source of fiber. Cereal fiber is found in whole grains and bran. Bran is the fiber-rich outer coat of cereal grain, which is largely removed in refining. In whole-grain cereals, the bran remains. In breakfast cereals, the largest amount of fiber is found in those with "bran" in their  names. The fiber content is sometimes indicated on the label.   You may need to include additional fruits and vegetables each day.   In baking, for 1 cup white flour, you may use the following substitutions:   1 cup whole-wheat flour minus 2 tablespoons.   1/2 cup white flour plus 1/2 cup whole-wheat flour.   Diverticulosis Diverticulosis is a common condition that develops when small pouches (diverticula) form in the wall of the colon. The risk of diverticulosis increases with age. It happens more often in people who eat a low-fiber diet. Most individuals with diverticulosis have no symptoms. Those individuals with symptoms usually experience belly (abdominal) pain, constipation, or loose stools (diarrhea).  HOME CARE INSTRUCTIONS  Increase the amount of fiber in your diet as directed by your caregiver or dietician. This may reduce symptoms of diverticulosis.   Drink at least 6 to 8 glasses of water each day to prevent constipation.   Try not to strain when  you have a bowel movement.   THERE IS NO NEED TO Avoid nuts and seeds to prevent complications.   FOODS HAVING HIGH FIBER CONTENT INCLUDE:  Fruits. Apple, peach, pear, tangerine, raisins, prunes.   Vegetables. Brussels sprouts, asparagus, broccoli, cabbage, carrot, cauliflower, romaine lettuce, spinach, summer squash, tomato, winter squash, zucchini.   Starchy Vegetables. Baked beans, kidney beans, lima beans, split peas, lentils, potatoes (with skin).   Grains. Whole wheat bread, brown rice, bran flake cereal, plain oatmeal, white rice, shredded wheat, bran muffins.

## 2019-04-23 ENCOUNTER — Encounter (HOSPITAL_COMMUNITY): Payer: Self-pay | Admitting: Gastroenterology

## 2019-04-23 ENCOUNTER — Telehealth: Payer: Self-pay | Admitting: Gastroenterology

## 2019-04-23 NOTE — Telephone Encounter (Signed)
Reminder in epic °

## 2019-04-23 NOTE — Telephone Encounter (Signed)
Tried calling both number for pt. VM wasn't set up. Will call pt back.

## 2019-04-23 NOTE — Telephone Encounter (Signed)
PLEASE CALL PT. THE BIOPSIES SHOW THAT THE FINDING ON THE CT SCAN IS MOST LIKELY DUE TO PARTIAL OBSTRUCTION OF YOUR SIGMOID COLON AFTER A SEVERE EPISODE OF DIVERTICULITIS.   DRINK WATER TO KEEP URINE LIGHT YELLOW.  CONTINUE YOUR WEIGHT LOSS EFFORTS.  I RECOMMEND YOU READ AND FOLLOW RECOMMENDATIONS BY DR. MARK HYMAN, "10-DAY DETOX DIET". IF YOU TRY AND CANNOT LOSE WEIGHT OVER THE NEXT YEAR, PLEASE CALL FOR A REFERRAL TO THE BARIATRIC WEIGHT LOSS CLINIC.  FOLLOW A HIGH FIBER DIET. AVOID ITEMS THAT CAUSE BLOATING & GAS.   TO REDUCE ABDOMINAL PAIN DUE TO THE PARTIAL SIGMOID OBSTRUCTION, ADD ONE OR TWO COLACE DAILY TO SOFTEN STOOL.  I WILL REFER YOU TO GENERAL SURGERY TO DISCUSS HAVING A SIGMOID COLECTOMY. LET ME KNOW IF YOU WANT TO SEE A SURGEON IN [place], OR [place].   FOLLOW UP IN 6 MOS WITH DR. Shanita Kanan.  Next colonoscopy in 10 years.

## 2019-04-24 ENCOUNTER — Telehealth: Payer: Self-pay | Admitting: Internal Medicine

## 2019-04-24 NOTE — Telephone Encounter (Signed)
PLEASE CALL PT. WEE REFER Refer HER to [facility], Dx: SIGMOID STRICTURE WITH PARTIAL OBSTRUCTION.

## 2019-04-24 NOTE — Telephone Encounter (Signed)
Returning call. Please call her back at [phone]

## 2019-04-24 NOTE — Telephone Encounter (Signed)
Pt notified of [facility] recommendations and results. Pt would prefer to see a surgeon in [place].  Susan-[facility] in 10 years,  Stacey-6 month with Dr. Oneida Alar

## 2019-04-24 NOTE — Telephone Encounter (Signed)
LMOM to call.

## 2019-04-27 NOTE — Telephone Encounter (Signed)
Reminder in epic °

## 2019-04-27 NOTE — Telephone Encounter (Signed)
Referral sent to [facility] 

## 2019-04-27 NOTE — Telephone Encounter (Signed)
Pt is aware that referral has been made. 

## 2019-04-27 NOTE — Telephone Encounter (Signed)
LMOM for a return call ( mobile), could not leave a message on home phone.

## 2019-04-29 ENCOUNTER — Encounter (HOSPITAL_COMMUNITY): Payer: Self-pay | Admitting: Anesthesiology

## 2019-06-03 NOTE — Telephone Encounter (Signed)
Patient scheduled to see Leighton Ruff 9/8 at 5:83EN

## 2019-06-16 ENCOUNTER — Ambulatory Visit: Payer: Self-pay | Admitting: General Surgery

## 2019-06-16 NOTE — H&P (Signed)
History of Present Illness Leighton Ruff MD; 06/10/4267 9:35 AM) The patient is a 53 year old female who presents with diverticulitis. 53 year old female who developed an episode of diverticulitis in January 2020. She was seen in the emergency department and given antibiotics. Her symptoms resolved within a couple weeks. She then developed a recurrent episode of abdominal pain in June. This prompted her to get a colonoscopy. Colonoscopy showed a sigmoid stricture with no sign of malignancy. She has no other medical problems. She takes no medications. She is having bowel function now with the help of stool softeners.   Diagnostic Studies History (Tanisha A. Owens Shark, [place]; 06/16/2019 9:17 AM) Colonoscopy within last year Mammogram within last year Pap Smear 1-5 years ago  Allergies (Tanisha A. Owens Shark, [place]; 06/16/2019 9:18 AM) No Known Drug Allergies [06/16/2019]: Allergies Reconciled  Medication History (Tanisha A. Owens Shark, [place]; 06/16/2019 9:18 AM) No Current Medications Medications Reconciled  Social History (Tanisha A. Owens Shark, [place]; 06/16/2019 9:17 AM) Caffeine use Carbonated beverages, Coffee, Tea. No alcohol use No drug use Tobacco use Never smoker.  Family History (Tanisha A. Owens Shark, [place]; 06/16/2019 9:17 AM) Hypertension Mother. Kidney Disease Brother. Respiratory Condition Father.  Pregnancy / Birth History (Tanisha A. Owens Shark, [place]; 06/16/2019 9:17 AM) Age at menarche 86 years. Age of menopause 60-50 Gravida 2 Maternal age 86-25 Para 2  Other Problems (Tanisha A. Owens Shark, [place]; 06/16/2019 9:17 AM) Diverticulosis Kidney Stone     Review of Systems (Tanisha A. Brown RMA; 06/16/2019 9:17 AM) General Not Present- Appetite Loss, Chills, Fatigue, Fever, Night Sweats, Weight Gain and Weight Loss. Skin Not Present- Change in Wart/Mole, Dryness, Hives, Jaundice, New Lesions, Non-Healing Wounds, Rash and Ulcer. HEENT Not Present- Earache, Hearing Loss, Hoarseness, Nose Bleed,  Oral Ulcers, Ringing in the Ears, Seasonal Allergies, Sinus Pain, Sore Throat, Visual Disturbances, Wears glasses/contact lenses and Yellow Eyes. Respiratory Not Present- Bloody sputum, Chronic Cough, Difficulty Breathing, Snoring and Wheezing. Cardiovascular Not Present- Chest Pain, Difficulty Breathing Lying Down, Leg Cramps, Palpitations, Rapid Heart Rate, Shortness of Breath and Swelling of Extremities. Gastrointestinal Not Present- Abdominal Pain, Bloating, Bloody Stool, Change in Bowel Habits, Chronic diarrhea, Constipation, Difficulty Swallowing, Excessive gas, Gets full quickly at meals, Hemorrhoids, Indigestion, Nausea, Rectal Pain and Vomiting. Female Genitourinary Not Present- Frequency, Nocturia, Painful Urination, Pelvic Pain and Urgency. Musculoskeletal Not Present- Back Pain, Joint Pain, Joint Stiffness, Muscle Pain, Muscle Weakness and Swelling of Extremities. Neurological Not Present- Decreased Memory, Fainting, Headaches, Numbness, Seizures, Tingling, Tremor, Trouble walking and Weakness. Psychiatric Not Present- Anxiety, Bipolar, Change in Sleep Pattern, Depression, Fearful and Frequent crying. Endocrine Not Present- Cold Intolerance, Excessive Hunger, Hair Changes, Heat Intolerance, Hot flashes and New Diabetes. Hematology Not Present- Blood Thinners, Easy Bruising, Excessive bleeding, Gland problems, HIV and Persistent Infections.  Vitals (Tanisha A. Brown RMA; 06/16/2019 9:18 AM) 06/16/2019 9:18 AM Weight: 287.8 lb Height: 65in Body Surface Area: 2.31 m Body Mass Index: 47.89 kg/m  Temp.: 97.9F  Pulse: 89 (Regular)  BP: 132/84 (Sitting, Left Arm, Standard)        Physical Exam Leighton Ruff MD; 12/09/1958 9:46 AM)  General Mental Status-Alert. General Appearance-Not in acute distress. Build & Nutrition-Well nourished. Posture-Normal posture. Gait-Normal.  Head and Neck Head-normocephalic, atraumatic with no lesions or palpable  masses. Trachea-midline.  Chest and Lung Exam Chest and lung exam reveals -on auscultation, normal breath sounds, no adventitious sounds and normal vocal resonance.  Cardiovascular Cardiovascular examination reveals -normal heart sounds, regular rate and rhythm with no murmurs and no digital clubbing, cyanosis, edema, increased  warmth or tenderness.  Abdomen Inspection Inspection of the abdomen reveals - No Hernias. Palpation/Percussion Palpation and Percussion of the abdomen reveal - Soft, Non Tender, No Rigidity (guarding), No hepatosplenomegaly and No Palpable abdominal masses.  Neurologic Neurologic evaluation reveals -alert and oriented x 3 with no impairment of recent or remote memory, normal attention span and ability to concentrate, normal sensation and normal coordination.  Musculoskeletal Normal Exam - Bilateral-Upper Extremity Strength Normal and Lower Extremity Strength Normal.    Assessment & Plan Romie Levee(Osiel Stick MD; 06/16/2019 9:37 AM)  Charolett BumpersIVERTICULAR STRICTURE [phone](K56.699) Impression: 53 year old female who presents to the office with a diverticular stricture. This corresponds to an episode of diverticulitis that she had in January. We discussed that surgical excision would be the best way to prevent further complications. We will plan on doing this in a minimally invasive fashion. All questions were answered. The surgery and anatomy were described to the patient as well as the risks of surgery and the possible complications. These include: Bleeding, deep abdominal infections and possible wound complications such as hernia and infection, damage to adjacent structures, leak of surgical connections, which can lead to other surgeries and possibly an ostomy, possible need for other procedures, such as abscess drains in radiology, possible prolonged hospital stay, possible diarrhea from removal of part of the colon, possible constipation from narcotics, possible bowel, bladder  or sexual dysfunction if having rectal surgery, prolonged fatigue/weakness or appetite loss, possible early recurrence of of disease, possible complications of their medical problems such as heart disease or arrhythmias or lung problems, death (less than 1%). I believe the patient understands and wishes to proceed with the surgery.

## 2019-09-17 ENCOUNTER — Telehealth: Payer: Self-pay | Admitting: Gastroenterology

## 2019-09-17 ENCOUNTER — Encounter: Payer: Self-pay | Admitting: Gastroenterology

## 2019-09-17 NOTE — Telephone Encounter (Signed)
Per [facility] OV 01/2019 it stated MRI in 1 year. Although patient had CT 10/2018. [facility] please advise if patient needs MRI now or in April? Thanks

## 2019-09-17 NOTE — Telephone Encounter (Signed)
Recall mailed 

## 2019-09-17 NOTE — Telephone Encounter (Signed)
Patient needs MRI abd pancreas protocol without and with contrast one year from last imaging so due 10/2019. Dx: Occasional subcentimeter hypodense areas in the liver and pancreatic body are nonspecific and need surveillance.

## 2019-09-17 NOTE — Telephone Encounter (Signed)
Recall for mri °

## 2019-10-17 ENCOUNTER — Emergency Department (HOSPITAL_COMMUNITY)
Admission: EM | Admit: 2019-10-17 | Discharge: 2019-10-17 | Disposition: A | Discharge status: Home / Self Care | Payer: Managed Care, Other (non HMO) | Attending: Emergency Medicine | Admitting: Emergency Medicine

## 2019-10-17 ENCOUNTER — Other Ambulatory Visit: Payer: Self-pay

## 2019-10-17 ENCOUNTER — Encounter (HOSPITAL_COMMUNITY): Payer: Self-pay | Admitting: *Deleted

## 2019-10-17 ENCOUNTER — Emergency Department (HOSPITAL_COMMUNITY): Payer: Managed Care, Other (non HMO)

## 2019-10-17 DIAGNOSIS — U071 COVID-19: Secondary | ICD-10-CM | POA: Diagnosis not present

## 2019-10-17 DIAGNOSIS — R0602 Shortness of breath: Secondary | ICD-10-CM | POA: Diagnosis present

## 2019-10-17 LAB — CBC WITH DIFFERENTIAL/PLATELET
Abs Immature Granulocytes: 0.06 10*3/uL (ref 0.00–0.07)
Basophils Absolute: 0 10*3/uL (ref 0.0–0.1)
Basophils Relative: 0 %
Eosinophils Absolute: 0 10*3/uL (ref 0.0–0.5)
Eosinophils Relative: 0 %
HCT: 37.1 % (ref 36.0–46.0)
Hemoglobin: 11.4 g/dL — ABNORMAL LOW (ref 12.0–15.0)
Immature Granulocytes: 1 %
Lymphocytes Relative: 14 %
Lymphs Abs: 0.7 10*3/uL (ref 0.7–4.0)
MCH: 26.1 pg (ref 26.0–34.0)
MCHC: 30.7 g/dL (ref 30.0–36.0)
MCV: 84.9 fL (ref 80.0–100.0)
Monocytes Absolute: 0.4 10*3/uL (ref 0.1–1.0)
Monocytes Relative: 8 %
Neutro Abs: 3.8 10*3/uL (ref 1.7–7.7)
Neutrophils Relative %: 77 %
Platelets: 249 10*3/uL (ref 150–400)
RBC: 4.37 MIL/uL (ref 3.87–5.11)
RDW: 13.4 % (ref 11.5–15.5)
WBC: 4.9 10*3/uL (ref 4.0–10.5)
nRBC: 0 % (ref 0.0–0.2)

## 2019-10-17 LAB — URINALYSIS, ROUTINE W REFLEX MICROSCOPIC
Bacteria, UA: NONE SEEN
Bilirubin Urine: NEGATIVE
Glucose, UA: NEGATIVE mg/dL
Hgb urine dipstick: NEGATIVE
Ketones, ur: 5 mg/dL — AB
Leukocytes,Ua: NEGATIVE
Nitrite: NEGATIVE
Protein, ur: 100 mg/dL — AB
Specific Gravity, Urine: 1.023 (ref 1.005–1.030)
pH: 6 (ref 5.0–8.0)

## 2019-10-17 LAB — BASIC METABOLIC PANEL
Anion gap: 10 (ref 5–15)
BUN: 12 mg/dL (ref 6–20)
CO2: 28 mmol/L (ref 22–32)
Calcium: 8.1 mg/dL — ABNORMAL LOW (ref 8.9–10.3)
Chloride: 98 mmol/L (ref 98–111)
Creatinine, Ser: 0.98 mg/dL (ref 0.44–1.00)
GFR calc Af Amer: >60 mL/min (ref >60)
GFR calc non Af Amer: >60 mL/min (ref >60)
Glucose, Bld: 110 mg/dL — ABNORMAL HIGH (ref 70–99)
Potassium: 3.3 mmol/L — ABNORMAL LOW (ref 3.5–5.1)
Sodium: 136 mmol/L (ref 135–145)

## 2019-10-17 LAB — POC SARS CORONAVIRUS 2 AG -  ED: SARS Coronavirus 2 Ag: POSITIVE — AB

## 2019-10-17 MED ORDER — ACETAMINOPHEN 500 MG PO TABS
1000.0000 mg | ORAL_TABLET | Freq: Once | ORAL | Status: AC
  Administered 2019-10-17: 1000 mg via ORAL
  Filled 2019-10-17: qty 2

## 2019-10-17 NOTE — ED Triage Notes (Signed)
Pt c/o sob for past 3 days, worse today with any exertion.  Fever noted in triage. Denies covid contact.

## 2019-10-17 NOTE — ED Provider Notes (Signed)
[facility] EMERGENCY DEPARTMENT Provider Note   CSN: [id number] Arrival date & time: 10/17/19  1553     History Chief Complaint  Patient presents with  . Shortness of Breath    Kara Moreno is a 54 y.o. female.  The history is provided by the patient. No language interpreter was used.  Shortness of Breath Severity:  Moderate Onset quality:  Gradual Timing:  Constant Progression:  Worsening Chronicity:  New Relieved by:  Nothing Worsened by:  Nothing Ineffective treatments:  None tried Associated symptoms: chest pain and fever   Pt complains of bodyaches and cough.  Pt reports she has a fever.  Pt unaware of covid exposure     Past Medical History:  Diagnosis Date  . Kidney stone   . Obesity     Patient Active Problem List   Diagnosis Date Noted  . Abnormal CT scan, colon 01/15/2019  . Liver lesion 01/15/2019    Past Surgical History:  Procedure Laterality Date  . BIOPSY  04/20/2019   Procedure: BIOPSY;  Surgeon: West Bali, MD;  Location: [facility];  Service: Endoscopy;;  . COLONOSCOPY N/A 04/20/2019   Procedure: COLONOSCOPY;  Surgeon: West Bali, MD;  Location: [facility];  Service: Endoscopy;  Laterality: N/A;  8:30am  . TUBAL LIGATION       OB History   No obstetric history on file.     Family History  Problem Relation Age of Onset  . Diabetes Mellitus II Mother   . Diverticulitis Mother   . Lung cancer Father   . Renal Disease Brother   . Colon cancer Neg Hx   . Inflammatory bowel disease Neg Hx     Social History   Tobacco Use  . Smoking status: Never Smoker  . Smokeless tobacco: Never Used  Substance Use Topics  . Alcohol use: Never  . Drug use: Never    Home Medications Prior to Admission medications   Medication Sig Start Date End Date Taking? Authorizing Provider  amoxicillin-clavulanate (AUGMENTIN) 875-125 MG tablet Take 1 tablet by mouth 2 (two) times daily. 10/16/19  Yes [provider]     Allergies    Patient has no known allergies.  Review of Systems   Review of Systems  Constitutional: Positive for fever.  Respiratory: Positive for shortness of breath.   Cardiovascular: Positive for chest pain.  All other systems reviewed and are negative.   Physical Exam Updated Vital Signs BP 119/71   Pulse 87   Temp (!) 100.8 F (38.2 C) (Oral)   Resp (!) 21   Ht 5\' 5"  (1.651 m)   Wt 122.5 kg   SpO2 96%   BMI 44.93 kg/m   Physical Exam Vitals and nursing note reviewed.  Constitutional:      Appearance: She is well-developed.  HENT:     Head: Normocephalic.  Cardiovascular:     Rate and Rhythm: Normal rate and regular rhythm.  Pulmonary:     Effort: Pulmonary effort is normal.     Breath sounds: No decreased breath sounds.  Chest:     Chest wall: No mass.  Abdominal:     General: There is no distension.  Musculoskeletal:        General: Normal range of motion.     Cervical back: Normal range of motion.  Skin:    General: Skin is warm.  Neurological:     General: No focal deficit present.     Mental Status: She is  alert and oriented to person, place, and time.  Psychiatric:        Mood and Affect: Mood normal.     ED Results / Procedures / Treatments   Labs (all labs ordered are listed, but only abnormal results are displayed) Labs Reviewed  URINALYSIS, ROUTINE W REFLEX MICROSCOPIC - Abnormal; Notable for the following components:      Result Value   APPearance HAZY (*)    Ketones, ur 5 (*)    Protein, ur 100 (*)    All other components within normal limits  CBC WITH DIFFERENTIAL/PLATELET - Abnormal; Notable for the following components:   Hemoglobin 11.4 (*)    All other components within normal limits  BASIC METABOLIC PANEL - Abnormal; Notable for the following components:   Potassium 3.3 (*)    Glucose, Bld 110 (*)    Calcium 8.1 (*)    All other components within normal limits  POC SARS CORONAVIRUS 2 AG -  ED - Abnormal; Notable for  the following components:   SARS Coronavirus 2 Ag POSITIVE (*)    All other components within normal limits    EKG None  Radiology DG Chest Port 1 View  Result Date: 10/17/2019 CLINICAL DATA:  Shortness of breath for 3 days.  Fever. EXAM: PORTABLE CHEST 1 VIEW COMPARISON:  None. FINDINGS: The heart size borderline. The hila and mediastinum are unremarkable. Bilateral pulmonary infiltrates, particularly in the bases. No other acute abnormalities identified. IMPRESSION: Bilateral pulmonary infiltrates suspicious for an infectious process. Atypical infections should be considered. Electronically Signed   By: Dorise Bullion III M.D   On: 10/17/2019 18:38  An After Visit Summary was printed and given to the patient.   Procedures Procedures (including critical care time)  Medications Ordered in ED Medications  acetaminophen (TYLENOL) tablet 1,000 mg (1,000 mg Oral Given 10/17/19 1629)    ED Course  I have reviewed the triage vital signs and the nursing notes.  Pertinent labs & imaging results that were available during my care of the patient were reviewed by me and considered in my medical decision making (see chart for details).    MDM Rules/Calculators/A&P                      Chest xray shows bilat pulmonary infiltrates.  Pt has positive poc Covid.  Pt counseled on covid and symptomatic treatment.  Pt advised to return if symptoms worsen or change.  Pt is not hypoxic. Temp improved with tylenol.  Final Clinical Impression(s) / ED Diagnoses Final diagnoses:  [id number]    Rx / DC Orders ED Discharge Orders    None       Sidney Ace 10/17/19 2152    Milton Ferguson, MD 10/18/19 1600

## 2019-10-18 ENCOUNTER — Telehealth: Payer: Self-pay | Admitting: Infectious Diseases

## 2019-10-18 NOTE — Telephone Encounter (Signed)
Called to discuss with patient about Covid symptoms and the use of bamlanivimab, a monoclonal antibody infusion for those with mild to moderate Covid symptoms and at a high risk of hospitalization.  Pt is qualified for this infusion at the [facility] infusion center due to BMI>35.  Symptoms started 1/04 per ER record. CXR with b/l pulmonary infiltrates.   Message left to call back for monoclonal antibody infusion

## 2020-06-13 IMAGING — DX DG CHEST 1V PORT
1 series · 1 of 1 positions shown · non-contrast
Comparison: None.

CLINICAL DATA: Shortness of breath for 3 days.  Fever.

EXAM:
PORTABLE CHEST 1 VIEW

[chest ap]
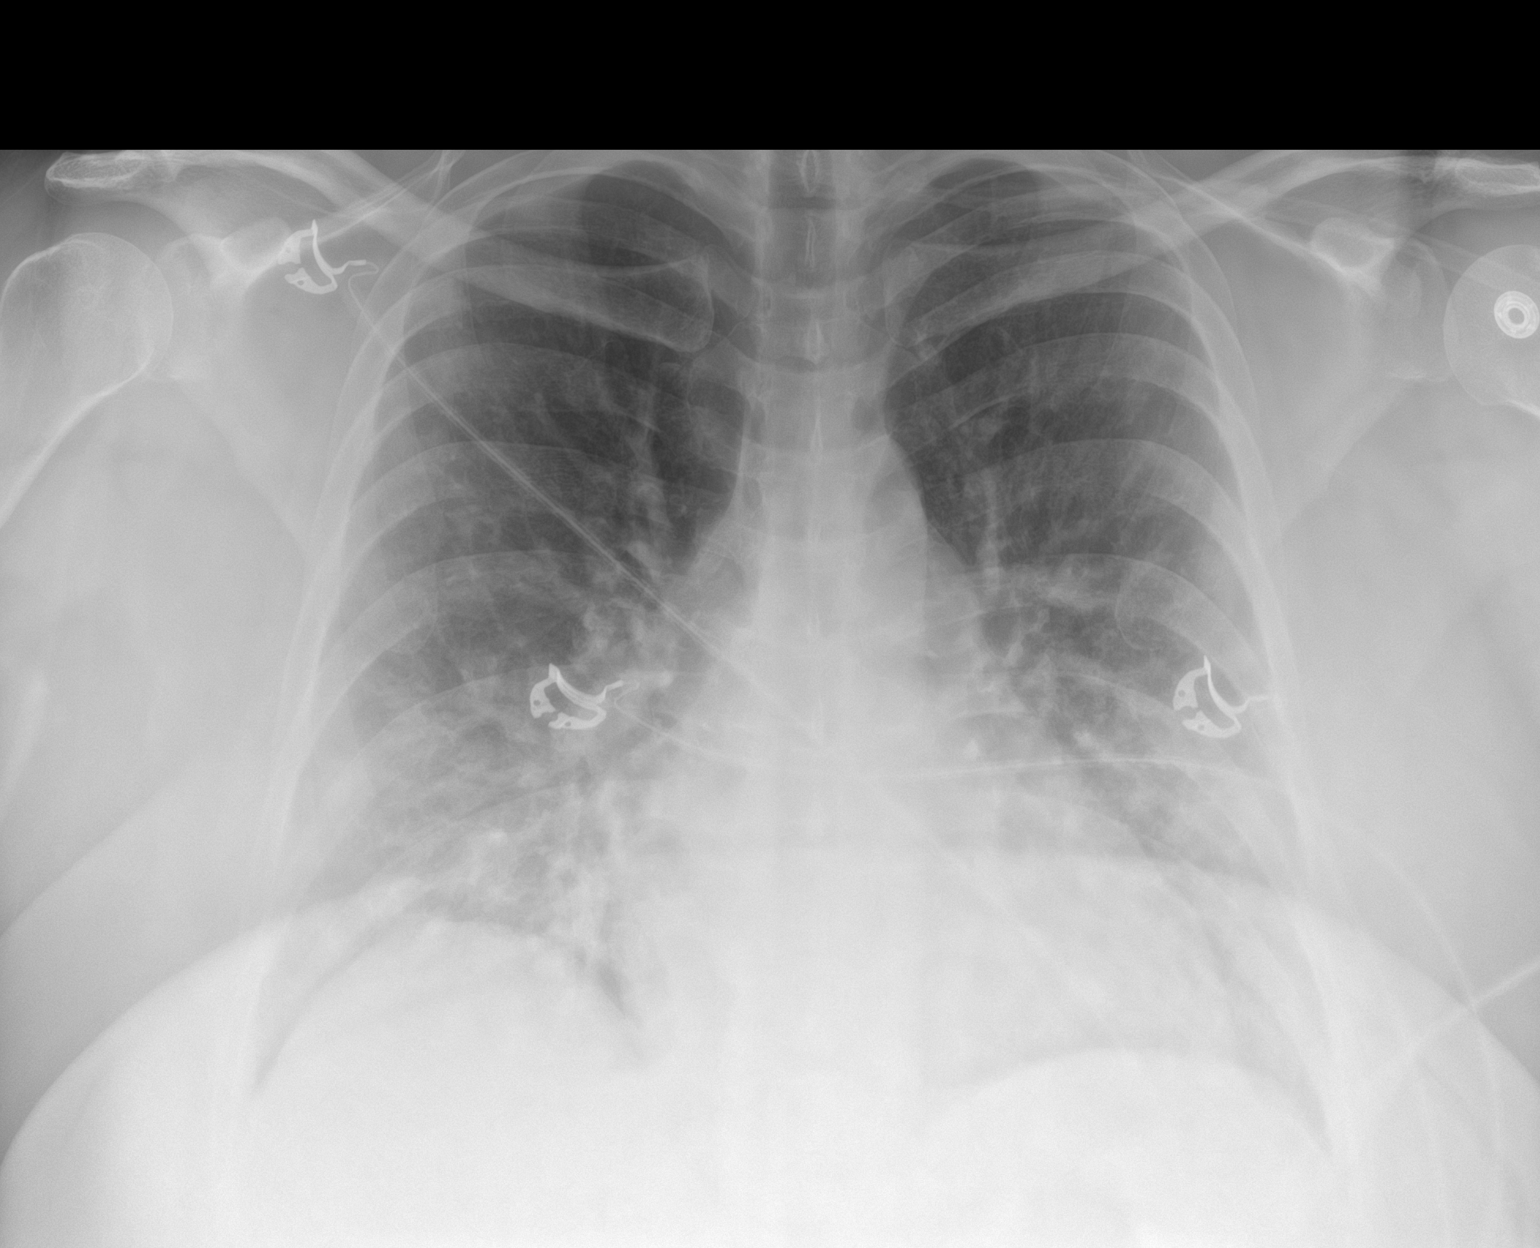

[1 of 1 positions shown; findings below may reference images not displayed]

FINDINGS: The heart size borderline. The hila and mediastinum are
unremarkable. Bilateral pulmonary infiltrates, particularly in the
bases. No other acute abnormalities identified.
IMPRESSION: Bilateral pulmonary infiltrates suspicious for an infectious
process. Atypical infections should be considered.

## 2022-05-11 ENCOUNTER — Ambulatory Visit
Admission: RE | Admit: 2022-05-11 | Discharge: 2022-05-11 | Disposition: A | Discharge status: Home / Self Care | Payer: Managed Care, Other (non HMO) | Source: Ambulatory Visit | Attending: Nurse Practitioner | Admitting: Nurse Practitioner

## 2022-05-11 VITALS — BP 141/87 | HR 84 | Temp 99.1°F | Resp 17

## 2022-05-11 DIAGNOSIS — K0889 Other specified disorders of teeth and supporting structures: Secondary | ICD-10-CM | POA: Diagnosis not present

## 2022-05-11 DIAGNOSIS — R220 Localized swelling, mass and lump, head: Secondary | ICD-10-CM

## 2022-05-11 MED ORDER — IBUPROFEN 800 MG PO TABS: 800.0000 mg | ORAL_TABLET | Freq: Three times a day (TID) | ORAL | 0 refills | Status: AC | PRN

## 2022-05-11 MED ORDER — CHLORHEXIDINE GLUCONATE 0.12 % MT SOLN: 15.0000 mL | Freq: Two times a day (BID) | OROMUCOSAL | 0 refills | Status: AC

## 2022-05-11 MED ORDER — AMOXICILLIN 500 MG PO CAPS: 500.0000 mg | ORAL_CAPSULE | Freq: Three times a day (TID) | ORAL | 0 refills | Status: AC

## 2022-05-11 NOTE — ED Triage Notes (Signed)
Pt reports abscess on gum x 1 day. States  gum sensitivity to touch and drainage makes her nauseous. Advil and oral gel gives some relief.

## 2022-05-11 NOTE — ED Provider Notes (Signed)
[facility]    CSN: [id number] Arrival date & time: 05/11/22  1351      History   Chief Complaint Chief Complaint  Patient presents with   Dental Problem    Abcess on gum burst gums are sensitive to touch now have drainage that is making me feel sick - Entered by patient   Dental Pain    HPI Kara Moreno is a 56 y.o. female.   The history is provided by the patient.   Patient reports for complaints of gum sensitivity and gum pain that is been present for the past 2 to 3 days.  Patient states on one of her teeth, the feeling came off.  She states that she tried to get a temporary filling.  She states since that time, she has had sensitivity to her gums and feels like she has drainage that is making her nauseated.  She states she has mouth/dental pain with eating, talking, and chewing.  She denies fever, chills, chest pain, abdominal pain, vomiting, or diarrhea.  Patient states that she tried to contact her dentist, but she was unable to get in touch with them because the office was closed today.  Past Medical History:  Diagnosis Date   Kidney stone    Obesity     Patient Active Problem List   Diagnosis Date Noted   Abnormal CT scan, colon 01/15/2019   Liver lesion 01/15/2019    Past Surgical History:  Procedure Laterality Date   BIOPSY  04/20/2019   Procedure: BIOPSY;  Surgeon: West Bali, MD;  Location: [facility];  Service: Endoscopy;;   COLONOSCOPY N/A 04/20/2019   Procedure: COLONOSCOPY;  Surgeon: West Bali, MD;  Location: [facility];  Service: Endoscopy;  Laterality: N/A;  8:30am   TUBAL LIGATION      OB History   No obstetric history on file.      Home Medications    Prior to Admission medications   Medication Sig Start Date End Date Taking? Authorizing Provider  amoxicillin (AMOXIL) 500 MG capsule Take 1 capsule (500 mg total) by mouth 3 (three) times daily. 05/11/22  Yes Zollie Clemence-Warren, Sadie Haber, NP  chlorhexidine (PERIDEX)  0.12 % solution Use as directed 15 mLs in the mouth or throat 2 (two) times daily for 10 days. 05/11/22 05/21/22 Yes Syanne Looney-Warren, Sadie Haber, NP  ibuprofen (ADVIL) 800 MG tablet Take 1 tablet (800 mg total) by mouth every 8 (eight) hours as needed. 05/11/22  Yes Jenni Thew-Warren, Sadie Haber, NP  amoxicillin-clavulanate (AUGMENTIN) 875-125 MG tablet Take 1 tablet by mouth 2 (two) times daily. 10/16/19   [provider]    Family History Family History  Problem Relation Age of Onset   Diabetes Mellitus II Mother    Diverticulitis Mother    Lung cancer Father    Renal Disease Brother    Colon cancer Neg Hx    Inflammatory bowel disease Neg Hx     Social History Social History   Tobacco Use   Smoking status: Never   Smokeless tobacco: Never  Vaping Use   Vaping Use: Never used  Substance Use Topics   Alcohol use: Never   Drug use: Never     Allergies   Patient has no known allergies.   Review of Systems Review of Systems Per HPI  Physical Exam Triage Vital Signs ED Triage Vitals  Enc Vitals Group     BP 05/11/22 1402 (!) 141/87     Pulse Rate 05/11/22  1402 84     Resp 05/11/22 1402 17     Temp 05/11/22 1402 99.1 F (37.3 C)     Temp Source 05/11/22 1402 Oral     SpO2 05/11/22 1402 97 %     Weight --      Height --      Head Circumference --      Peak Flow --      Pain Score 05/11/22 1401 8     Pain Loc --      Pain Edu? --      Excl. in GC? --    No data found.  Updated Vital Signs BP (!) 141/87 (BP Location: Right Arm)   Pulse 84   Temp 99.1 F (37.3 C) (Oral)   Resp 17   SpO2 97%   Visual Acuity Right Eye Distance:   Left Eye Distance:   Bilateral Distance:    Right Eye Near:   Left Eye Near:    Bilateral Near:     Physical Exam Vitals and nursing note reviewed.  Constitutional:      General: She is not in acute distress.    Appearance: Normal appearance.  HENT:     Head: Normocephalic.     Right Ear: Tympanic membrane, ear canal and  external ear normal.     Left Ear: Tympanic membrane, ear canal and external ear normal.     Mouth/Throat:     Mouth: Mucous membranes are moist.     Dentition: Abnormal dentition. Dental tenderness and gingival swelling present. No dental abscesses or gum lesions.     Pharynx: No posterior oropharyngeal erythema.     Comments: Induration with central pustule noted to the right buccal mucosa.  There is no drainage or oozing present. Eyes:     Extraocular Movements: Extraocular movements intact.     Conjunctiva/sclera: Conjunctivae normal.     Pupils: Pupils are equal, round, and reactive to light.  Cardiovascular:     Rate and Rhythm: Normal rate and regular rhythm.     Pulses: Normal pulses.     Heart sounds: Normal heart sounds.  Pulmonary:     Effort: Pulmonary effort is normal.     Breath sounds: Normal breath sounds.  Abdominal:     General: Bowel sounds are normal.     Palpations: Abdomen is soft.  Musculoskeletal:     Cervical back: Normal range of motion.  Lymphadenopathy:     Cervical: No cervical adenopathy.  Skin:    General: Skin is warm and dry.  Neurological:     General: No focal deficit present.     Mental Status: She is alert and oriented to person, place, and time.  Psychiatric:        Mood and Affect: Mood normal.        Behavior: Behavior normal.      UC Treatments / Results  Labs (all labs ordered are listed, but only abnormal results are displayed) Labs Reviewed - No data to display  EKG   Radiology No results found.  Procedures Procedures (including critical care time)  Medications Ordered in UC Medications - No data to display  Initial Impression / Assessment and Plan / UC Course  I have reviewed the triage vital signs and the nursing notes.  Pertinent labs & imaging results that were available during my care of the patient were reviewed by me and considered in my medical decision making (see chart for details).  Patient with pain and  tenderness of her gums and teeth.  She also has an induration to the right buccal mucosa with a central pustule.  Do not see any obvious signs of abscess at this time; however, we will cover patient with amoxicillin and Peridex mouthwash for gingival concerns.  Supportive care recommendations were provided to the patient.  Patient was advised to follow-up with her dentist within the next 7 to 10 days for further evaluation. Final Clinical Impressions(s) / UC Diagnoses   Final diagnoses:  Gingival swelling  Dentalgia     Discharge Instructions         Take medication as prescribed. May take over-the-counter Tylenol extra strength 1 to 2 hours after ibuprofen for breakthrough pain. Warm salt water gargles 3-4 times daily until symptoms improve. Continue warm compresses to the affected area to help with pain and discomfort. Recommend following up your dentist within the next 7 to 10 days.      ED Prescriptions     Medication Sig Dispense Auth. Provider   chlorhexidine (PERIDEX) 0.12 % solution Use as directed 15 mLs in the mouth or throat 2 (two) times daily for 10 days. 300 mL Lennyn Bellanca-Warren, Sadie Haber, NP   amoxicillin (AMOXIL) 500 MG capsule Take 1 capsule (500 mg total) by mouth 3 (three) times daily. 21 capsule Aycen Porreca-Warren, Sadie Haber, NP   ibuprofen (ADVIL) 800 MG tablet Take 1 tablet (800 mg total) by mouth every 8 (eight) hours as needed. 30 tablet Weslee Prestage-Warren, Sadie Haber, NP      PDMP not reviewed this encounter.   Abran Cantor, NP 05/11/22 1531

## 2022-05-11 NOTE — Discharge Instructions (Signed)
  Take medication as prescribed. May take over-the-counter Tylenol extra strength 1 to 2 hours after ibuprofen for breakthrough pain. Warm salt water gargles 3-4 times daily until symptoms improve. Continue warm compresses to the affected area to help with pain and discomfort. Recommend following up your dentist within the next 7 to 10 days.

## 2022-08-01 ENCOUNTER — Other Ambulatory Visit (HOSPITAL_COMMUNITY): Payer: Self-pay | Admitting: Nurse Practitioner

## 2022-08-01 DIAGNOSIS — Z1231 Encounter for screening mammogram for malignant neoplasm of breast: Secondary | ICD-10-CM

## 2022-08-27 ENCOUNTER — Ambulatory Visit (HOSPITAL_COMMUNITY)
Admission: RE | Admit: 2022-08-27 | Discharge: 2022-08-27 | Disposition: A | Discharge status: Home / Self Care | Payer: Managed Care, Other (non HMO) | Source: Ambulatory Visit | Attending: Nurse Practitioner | Admitting: Nurse Practitioner

## 2022-08-27 DIAGNOSIS — Z1231 Encounter for screening mammogram for malignant neoplasm of breast: Secondary | ICD-10-CM | POA: Diagnosis present

## 2023-06-26 ENCOUNTER — Other Ambulatory Visit (HOSPITAL_COMMUNITY): Payer: Self-pay | Admitting: Nurse Practitioner

## 2023-06-26 DIAGNOSIS — Z1231 Encounter for screening mammogram for malignant neoplasm of breast: Secondary | ICD-10-CM

## 2023-06-26 DIAGNOSIS — E669 Obesity, unspecified: Secondary | ICD-10-CM

## 2023-09-02 ENCOUNTER — Ambulatory Visit (HOSPITAL_COMMUNITY)
Admission: RE | Admit: 2023-09-02 | Discharge: 2023-09-02 | Disposition: A | Discharge status: Home / Self Care | Payer: Managed Care, Other (non HMO) | Source: Ambulatory Visit | Attending: Nurse Practitioner | Admitting: Nurse Practitioner

## 2023-09-02 DIAGNOSIS — Z1231 Encounter for screening mammogram for malignant neoplasm of breast: Secondary | ICD-10-CM | POA: Diagnosis present
